# Patient Record
Sex: Male | Born: 1985 | Race: White | Hispanic: No | Marital: Single | Smoking: Current every day smoker
Health system: Southern US, Community
[De-identification: ages and names within clinical notes are randomized; demographics above are authoritative.]

## PROBLEM LIST (undated history)

## (undated) HISTORY — PX: HUMERUS FRACTURE SURGERY: SHX670

## (undated) HISTORY — PX: OTHER SURGICAL HISTORY: SHX169

---

## 2014-01-01 DIAGNOSIS — Z9889 Other specified postprocedural states: Secondary | ICD-10-CM | POA: Insufficient documentation

## 2015-10-16 DIAGNOSIS — Z720 Tobacco use: Secondary | ICD-10-CM | POA: Insufficient documentation

## 2017-12-05 ENCOUNTER — Encounter: Payer: Self-pay | Admitting: *Deleted

## 2017-12-09 ENCOUNTER — Encounter (HOSPITAL_COMMUNITY): Payer: Self-pay | Admitting: Emergency Medicine

## 2017-12-09 ENCOUNTER — Emergency Department (HOSPITAL_COMMUNITY): Payer: Self-pay

## 2017-12-09 ENCOUNTER — Emergency Department (HOSPITAL_COMMUNITY)
Admission: EM | Admit: 2017-12-09 | Discharge: 2017-12-09 | Disposition: A | Discharge status: Home / Self Care | Payer: Self-pay | Attending: Emergency Medicine | Admitting: Emergency Medicine

## 2017-12-09 DIAGNOSIS — N132 Hydronephrosis with renal and ureteral calculous obstruction: Secondary | ICD-10-CM | POA: Insufficient documentation

## 2017-12-09 DIAGNOSIS — R1110 Vomiting, unspecified: Secondary | ICD-10-CM | POA: Insufficient documentation

## 2017-12-09 LAB — URINALYSIS, ROUTINE W REFLEX MICROSCOPIC
BACTERIA UA: NONE SEEN
BILIRUBIN URINE: NEGATIVE
Glucose, UA: NEGATIVE mg/dL
Ketones, ur: NEGATIVE mg/dL
Leukocytes, UA: NEGATIVE
NITRITE: NEGATIVE
PROTEIN: NEGATIVE mg/dL
RBC / HPF: >50 RBC/hpf — ABNORMAL HIGH (ref 0–5)
SPECIFIC GRAVITY, URINE: 1.01 (ref 1.005–1.030)
pH: 8 (ref 5.0–8.0)

## 2017-12-09 LAB — BASIC METABOLIC PANEL
Anion gap: 8 (ref 5–15)
BUN: 13 mg/dL (ref 6–20)
CHLORIDE: 106 mmol/L (ref 101–111)
CO2: 25 mmol/L (ref 22–32)
CREATININE: 1.05 mg/dL (ref 0.61–1.24)
Calcium: 9.3 mg/dL (ref 8.9–10.3)
Glucose, Bld: 128 mg/dL — ABNORMAL HIGH (ref 65–99)
POTASSIUM: 4.2 mmol/L (ref 3.5–5.1)
SODIUM: 139 mmol/L (ref 135–145)

## 2017-12-09 LAB — CBC
HCT: 43.3 % (ref 39.0–52.0)
Hemoglobin: 15.1 g/dL (ref 13.0–17.0)
MCH: 31.7 pg (ref 26.0–34.0)
MCHC: 34.9 g/dL (ref 30.0–36.0)
MCV: 90.8 fL (ref 78.0–100.0)
PLATELETS: 163 10*3/uL (ref 150–400)
RBC: 4.77 MIL/uL (ref 4.22–5.81)
RDW: 13 % (ref 11.5–15.5)
WBC: 8.8 10*3/uL (ref 4.0–10.5)

## 2017-12-09 MED ORDER — TAMSULOSIN HCL 0.4 MG PO CAPS: 0.4000 mg | ORAL_CAPSULE | Freq: Every day | ORAL | 0 refills | Status: DC

## 2017-12-09 MED ORDER — HYDROMORPHONE HCL 1 MG/ML IJ SOLN
1.0000 mg | Freq: Once | INTRAMUSCULAR | Status: AC
  Administered 2017-12-09: 1 mg via INTRAVENOUS
  Filled 2017-12-09: qty 1

## 2017-12-09 MED ORDER — OXYCODONE-ACETAMINOPHEN 5-325 MG PO TABS: 1.0000 | ORAL_TABLET | Freq: Three times a day (TID) | ORAL | 0 refills | Status: DC | PRN

## 2017-12-09 MED ORDER — ONDANSETRON HCL 4 MG/2ML IJ SOLN
4.0000 mg | Freq: Once | INTRAMUSCULAR | Status: AC
  Administered 2017-12-09: 4 mg via INTRAVENOUS
  Filled 2017-12-09: qty 2

## 2017-12-09 MED ORDER — ONDANSETRON 4 MG PO TBDP: 4.0000 mg | ORAL_TABLET | Freq: Three times a day (TID) | ORAL | 0 refills | Status: DC | PRN

## 2017-12-09 MED ORDER — KETOROLAC TROMETHAMINE 30 MG/ML IJ SOLN
15.0000 mg | Freq: Once | INTRAMUSCULAR | Status: AC
  Administered 2017-12-09: 15 mg via INTRAVENOUS
  Filled 2017-12-09: qty 1

## 2017-12-09 NOTE — ED Notes (Signed)
Bladder scan completed, reading.

## 2017-12-09 NOTE — ED Provider Notes (Signed)
[facility]-EMERGENCY DEPT Provider Note   CSN: [id number] Arrival date & time: 12/09/17  0156     History   Chief Complaint Chief Complaint  Patient presents with  . Flank Pain    HPI Jeremy Ingram is a 32 y.o. male.  HPI is  Patient presents with left flank pain.  Began today.  History of kidney stones and states this feels the same.  Some urinary pressure.  No fevers or chills.  Has had vomiting.  States it feels like kidney stones and his last was around 7 years ago.  States he had to have lithotripsy at that time.  No diarrhea.  No fevers. History reviewed. No pertinent past medical history.  There are no active problems to display for this patient.   History reviewed. No pertinent surgical history.      Home Medications    Prior to Admission medications   Medication Sig Start Date End Date Taking? Authorizing Provider  diphenhydrAMINE (BENADRYL) 25 MG tablet Take 25 mg by mouth every 6 (six) hours as needed for sleep.   Yes [provider]  ondansetron (ZOFRAN-ODT) 4 MG disintegrating tablet Take 1 tablet (4 mg total) by mouth every 8 (eight) hours as needed for nausea or vomiting. 12/09/17   Benjiman Core, MD  oxyCODONE-acetaminophen (PERCOCET/ROXICET) 5-325 MG tablet Take 1-2 tablets by mouth every 8 (eight) hours as needed for severe pain. 12/09/17   Benjiman Core, MD  tamsulosin (FLOMAX) 0.4 MG CAPS capsule Take 1 capsule (0.4 mg total) by mouth daily. 12/09/17   Benjiman Core, MD    Family History No family history on file.  Social History Social History   Tobacco Use  . Smoking status: Not on file  Substance Use Topics  . Alcohol use: Not on file  . Drug use: Not on file     Allergies   Bactrim [sulfamethoxazole-trimethoprim] and Pertussis vaccines   Review of Systems Review of Systems  Constitutional: Negative for appetite change.  HENT: Negative for congestion.   Respiratory: Negative for shortness of  breath.   Cardiovascular: Negative for chest pain.  Gastrointestinal: Positive for abdominal pain and vomiting.  Genitourinary: Positive for flank pain.  Musculoskeletal: Negative for back pain.  Skin: Negative for rash.  Neurological: Negative for speech difficulty and weakness.  Hematological: Negative for adenopathy.  Psychiatric/Behavioral: Negative for confusion.     Physical Exam Updated Vital Signs BP 104/71 (BP Location: Right Arm)   Pulse 74   Temp 98.4 F (36.9 C) (Oral)   Resp 18   SpO2 100%   Physical Exam  Constitutional: He appears well-developed.  Patient appears uncomfortable  HENT:  Head: Atraumatic.  Eyes: Pupils are equal, round, and reactive to light.  Neck: Neck supple.  Pulmonary/Chest: Effort normal.  Abdominal: There is tenderness.  Left mid abdominal tenderness.  Genitourinary:  Genitourinary Comments: CVA tenderness on left side.  Musculoskeletal: He exhibits no tenderness.  Neurological: He is alert.  Skin: Skin is warm. Capillary refill takes less than 2 seconds.     ED Treatments / Results  Labs (all labs ordered are listed, but only abnormal results are displayed) Labs Reviewed  URINALYSIS, ROUTINE W REFLEX MICROSCOPIC - Abnormal; Notable for the following components:      Result Value   Color, Urine STRAW (*)    Hgb urine dipstick LARGE (*)    RBC / HPF >50 (*)    All other components within normal limits  BASIC METABOLIC PANEL - Abnormal; Notable  for the following components:   Glucose, Bld 128 (*)    All other components within normal limits  CBC    EKG None  Radiology Ct Renal Stone Study  Result Date: 12/09/2017 CLINICAL DATA:  Acute onset of left flank pain and difficulty urinating. EXAM: CT ABDOMEN AND PELVIS WITHOUT CONTRAST TECHNIQUE: Multidetector CT imaging of the abdomen and pelvis was performed following the standard protocol without IV contrast. COMPARISON:  None. FINDINGS: Lower chest: The visualized lung bases  are grossly clear. The visualized portions of the mediastinum are unremarkable. Minimal wall thickening at the distal esophagus may reflect chronic inflammation. Hepatobiliary: The liver is unremarkable in appearance. The gallbladder is unremarkable in appearance. The common bile duct remains normal in caliber. Pancreas: The pancreas is within normal limits. Spleen: The spleen is unremarkable in appearance. Adrenals/Urinary Tract: The adrenal glands are unremarkable. There is mild left-sided hydronephrosis, with left-sided perinephric stranding and fluid. An obstructing 6 x 5 mm stone is noted at the mid left ureter, 7 cm below the left renal pelvis. The right kidney is unremarkable. No nonobstructing renal stones are identified. Stomach/Bowel: The stomach is unremarkable in appearance. The small bowel is within normal limits. The appendix is normal in caliber, without evidence of appendicitis. The colon is unremarkable in appearance. Vascular/Lymphatic: The abdominal aorta is unremarkable in appearance. The inferior vena cava is grossly unremarkable. No retroperitoneal lymphadenopathy is seen. No pelvic sidewall lymphadenopathy is identified. Reproductive: The bladder is mildly distended and grossly unremarkable. The prostate is normal in size. Other: No additional soft tissue abnormalities are seen. Musculoskeletal: No acute osseous abnormalities are identified. The visualized musculature is unremarkable in appearance. IMPRESSION: 1. Mild left-sided hydronephrosis, with left-sided perinephric stranding and fluid. Obstructing 6 x 5 mm stone noted at the mid left ureter, 7 cm below the left renal pelvis. 2. Minimal wall thickening at the distal esophagus may reflect chronic inflammation. Electronically Signed   By: Roanna RaiderJeffery  Chang M.D.   On: 12/09/2017 03:10    Procedures Procedures (including critical care time)  Medications Ordered in ED Medications  HYDROmorphone (DILAUDID) injection 1 mg (1 mg Intravenous  Given 12/09/17 0226)  ondansetron (ZOFRAN) injection 4 mg (4 mg Intravenous Given 12/09/17 0226)  ketorolac (TORADOL) 30 MG/ML injection 15 mg (15 mg Intravenous Given 12/09/17 0251)     Initial Impression / Assessment and Plan / ED Course  I have reviewed the triage vital signs and the nursing notes.  Pertinent labs & imaging results that were available during my care of the patient were reviewed by me and considered in my medical decision making (see chart for details).     Patient with left-sided flank pain.  Found to have 5 mm ureteral stone.  Pain improved.  Will discharge home to follow-up with urology.  Given Percocet Zofran and Flomax for home  Final Clinical Impressions(s) / ED Diagnoses   Final diagnoses:  Ureteral stone with hydronephrosis    ED Discharge Orders        Ordered    oxyCODONE-acetaminophen (PERCOCET/ROXICET) 5-325 MG tablet  Every 8 hours PRN     12/09/17 0345    ondansetron (ZOFRAN-ODT) 4 MG disintegrating tablet  Every 8 hours PRN     12/09/17 0345    tamsulosin (FLOMAX) 0.4 MG CAPS capsule  Daily     12/09/17 0345       Benjiman CorePickering, Latif Nazareno, MD 12/09/17 [phone]

## 2017-12-09 NOTE — ED Triage Notes (Signed)
Pt comes to ed, via ems, left flank for a few hrs, difficulties with urination. Hx of  Kidney stones. V/s on arrival bp 142/89, hr 52, spo2 100, rr16.  20 left forearm.   Alert x4. No known allergies.

## 2017-12-09 NOTE — ED Notes (Signed)
Bed: WA17 Expected date:  Expected time:  Means of arrival:  Comments: 32 yr old flank pain

## 2017-12-13 ENCOUNTER — Ambulatory Visit: Payer: Self-pay | Attending: Critical Care Medicine | Admitting: Critical Care Medicine

## 2017-12-13 ENCOUNTER — Encounter: Payer: Self-pay | Admitting: Critical Care Medicine

## 2017-12-13 ENCOUNTER — Other Ambulatory Visit: Payer: Self-pay

## 2017-12-13 VITALS — BP 111/75 | HR 69 | Temp 97.5°F | Resp 16 | Ht 73.0 in | Wt 163.0 lb

## 2017-12-13 DIAGNOSIS — Z8709 Personal history of other diseases of the respiratory system: Secondary | ICD-10-CM | POA: Insufficient documentation

## 2017-12-13 DIAGNOSIS — J209 Acute bronchitis, unspecified: Secondary | ICD-10-CM | POA: Insufficient documentation

## 2017-12-13 DIAGNOSIS — Z716 Tobacco abuse counseling: Secondary | ICD-10-CM | POA: Insufficient documentation

## 2017-12-13 DIAGNOSIS — R55 Syncope and collapse: Secondary | ICD-10-CM | POA: Insufficient documentation

## 2017-12-13 DIAGNOSIS — Z720 Tobacco use: Secondary | ICD-10-CM

## 2017-12-13 DIAGNOSIS — Z882 Allergy status to sulfonamides status: Secondary | ICD-10-CM | POA: Insufficient documentation

## 2017-12-13 DIAGNOSIS — Z87442 Personal history of urinary calculi: Secondary | ICD-10-CM | POA: Insufficient documentation

## 2017-12-13 DIAGNOSIS — Z79899 Other long term (current) drug therapy: Secondary | ICD-10-CM | POA: Insufficient documentation

## 2017-12-13 DIAGNOSIS — Z023 Encounter for examination for recruitment to armed forces: Secondary | ICD-10-CM | POA: Insufficient documentation

## 2017-12-13 DIAGNOSIS — J310 Chronic rhinitis: Secondary | ICD-10-CM | POA: Insufficient documentation

## 2017-12-13 DIAGNOSIS — J45909 Unspecified asthma, uncomplicated: Secondary | ICD-10-CM | POA: Insufficient documentation

## 2017-12-13 DIAGNOSIS — F17210 Nicotine dependence, cigarettes, uncomplicated: Secondary | ICD-10-CM | POA: Insufficient documentation

## 2017-12-13 MED ORDER — TAMSULOSIN HCL 0.4 MG PO CAPS: 0.4000 mg | ORAL_CAPSULE | Freq: Every day | ORAL | 0 refills | Status: AC

## 2017-12-13 NOTE — Assessment & Plan Note (Signed)
Counseled for smoking cessation 

## 2017-12-13 NOTE — Assessment & Plan Note (Signed)
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

## 2017-12-13 NOTE — Progress Notes (Signed)
Subjective:    Patient ID: Jeremy Ingram, male    DOB: 1986-01-17, 32 y.o.   MRN: [id number]  32 y.o.M with hx of presyncope.  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 Pt was just in ED 12/09/17 for ureteral stone.  Pt has f/u with [facility] pending   [place] had an episode while living in [place], and PCP put Asthma on the chart.  Took a long time to correct.  Pt needs to be cleared he does not have asthma.  Pt then went to urgent care for PFTs,  Signed off as normal.  No hx of dyspnea.  Did have episode of bronchitis in the past.  No real cough now.  No chest pains.  No wheeze.  No mucus.  Notes some rhinitis with dust exposure.    No other allergies.  No chronic cough.     History reviewed. No pertinent past medical history.   History reviewed. No pertinent family history.   Social History   Socioeconomic History  . Marital status: Single    Spouse name: Not on file  . Number of children: Not on file  . Years of education: Not on file  . Highest education level: Not on file  Occupational History  . Not on file  Social Needs  . Financial resource strain: Not on file  . Food insecurity:    Worry: Not on file    Inability: Not on file  . Transportation needs:    Medical: Not on file    Non-medical: Not on file  Tobacco Use  . Smoking status: Current Every Day Smoker    Types: Cigarettes  . Smokeless tobacco: Never Used  Substance and Sexual Activity  . Alcohol use: Not on file  . Drug use: Not on file  . Sexual activity: Not on file  Lifestyle  . Physical activity:    Days per week: Not on file    Minutes per session: Not on file  . Stress: Not on file  Relationships  . Social connections:    Talks on phone: Not on file    Gets together: Not on file    Attends religious service: Not on file    Active member of club or organization: Not on file    Attends meetings of clubs or organizations: Not on file    Relationship status: Not on file  .  Intimate partner violence:    Fear of current or ex partner: Not on file    Emotionally abused: Not on file    Physically abused: Not on file    Forced sexual activity: Not on file  Other Topics Concern  . Not on file  Social History Narrative  . Not on file     Allergies  Allergen Reactions  . Bactrim [Sulfamethoxazole-Trimethoprim] Swelling  . Pertussis Vaccines      Outpatient Medications Prior to Visit  Medication Sig Dispense Refill  . diphenhydrAMINE (BENADRYL) 25 MG tablet Take 25 mg by mouth every 6 (six) hours as needed for sleep.    Marland Kitchen. oxyCODONE (OXY IR/ROXICODONE) 5 MG immediate release tablet Take 5 mg by mouth every 6 (six) hours as needed.    . ondansetron (ZOFRAN-ODT) 4 MG disintegrating tablet Take 1 tablet (4 mg total) by mouth every 8 (eight) hours as needed for nausea or vomiting. 8 tablet 0  . oxyCODONE-acetaminophen (PERCOCET/ROXICET) 5-325 MG tablet Take 1-2 tablets by mouth every 8 (eight) hours as needed for severe pain. 10 tablet  0  . tamsulosin (FLOMAX) 0.4 MG CAPS capsule Take 1 capsule (0.4 mg total) by mouth daily. 7 capsule 0   No facility-administered medications prior to visit.       Review of Systems  Constitutional: Negative.   HENT: Negative.   Eyes: Negative.   Respiratory: Negative for apnea, cough, chest tightness, shortness of breath, wheezing and stridor.   Cardiovascular: Negative for chest pain and leg swelling.  Gastrointestinal: Negative.   Genitourinary: Positive for flank pain. Negative for urgency.  Neurological: Negative.   Hematological: Negative.   Psychiatric/Behavioral: Negative.        Objective:   Physical Exam Vitals:   12/13/17 0851  BP: 111/75  Pulse: 69  Resp: 16  Temp: (!) 97.5 F (36.4 C)  TempSrc: Oral  SpO2: 100%  Weight: 163 lb (73.9 kg)  Height: 6\' 1"  (1.854 m)    Gen: Pleasant, well-nourished, in no distress,  normal affect  ENT: No lesions,  mouth clear,  oropharynx clear, no postnasal  drip  Neck: No JVD, no TMG, no carotid bruits  Lungs: No use of accessory muscles, no dullness to percussion, clear without rales or rhonchi  Cardiovascular: RRR, heart sounds normal, no murmur or gallops, no peripheral edema  Abdomen: soft and NT, no HSM,  BS normal  Musculoskeletal: No deformities, no cyanosis or clubbing  Neuro: alert, non focal  Skin: Warm, no lesions or rashes  No results found.        Assessment & Plan:  I personally reviewed all images and lab data in the [facility] system as well as any outside material available during this office visit and agree with the  radiology impressions.   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    I

## 2017-12-13 NOTE — Patient Instructions (Signed)
Focus on smoking cessation A lung function test will be obtained We will obtained the lung function actual results from [facility] We will call you when all paper work is prepared We expect to be able to clear you for [employer] duty Return as needed

## 2017-12-13 NOTE — Progress Notes (Signed)
Pt request evaluation and letter from pulmonologist regarding PFT.  Clearance needed to enlist in [employer].

## 2017-12-15 ENCOUNTER — Encounter: Payer: Self-pay | Admitting: Critical Care Medicine

## 2017-12-15 ENCOUNTER — Ambulatory Visit (HOSPITAL_COMMUNITY)
Admission: RE | Admit: 2017-12-15 | Discharge: 2017-12-15 | Disposition: A | Discharge status: Home / Self Care | Payer: Self-pay | Source: Ambulatory Visit | Attending: Critical Care Medicine | Admitting: Critical Care Medicine

## 2017-12-15 DIAGNOSIS — Z8709 Personal history of other diseases of the respiratory system: Secondary | ICD-10-CM | POA: Insufficient documentation

## 2017-12-15 LAB — PULMONARY FUNCTION TEST
FEF 25-75 Post: 4.45 L/sec
FEF 25-75 Pre: 3.18 L/sec
FEF2575-%Change-Post: 39 %
FEF2575-%Pred-Post: 96 %
FEF2575-%Pred-Pre: 69 %
FEV1-%CHANGE-POST: 7 %
FEV1-%PRED-PRE: 90 %
FEV1-%Pred-Post: 97 %
FEV1-PRE: 4.33 L
FEV1-Post: 4.65 L
FEV1FVC-%CHANGE-POST: 7 %
FEV1FVC-%Pred-Pre: 91 %
FEV6-%Change-Post: 0 %
FEV6-%PRED-PRE: 98 %
FEV6-%Pred-Post: 98 %
FEV6-POST: 5.75 L
FEV6-PRE: 5.77 L
FEV6FVC-%Change-Post: 1 %
FEV6FVC-%PRED-POST: 102 %
FEV6FVC-%PRED-PRE: 100 %
FVC-%CHANGE-POST: 0 %
FVC-%PRED-POST: 98 %
FVC-%PRED-PRE: 98 %
FVC-POST: 5.83 L
FVC-PRE: 5.86 L
POST FEV6/FVC RATIO: 100 %
PRE FEV1/FVC RATIO: 74 %
PRE FEV6/FVC RATIO: 98 %
Post FEV1/FVC ratio: 80 %

## 2017-12-15 MED ORDER — ALBUTEROL SULFATE (2.5 MG/3ML) 0.083% IN NEBU
2.5000 mg | INHALATION_SOLUTION | Freq: Once | RESPIRATORY_TRACT | Status: AC
  Administered 2017-12-15: 2.5 mg via RESPIRATORY_TRACT

## 2017-12-15 NOTE — Progress Notes (Signed)
To whom it may concern:  Mr Jeremy Ingram was seen in my pulmonary clinic on 12/13/17 for a pulmonary evaluation and lung function testing.  I have reviewed his [facility] and the letter they produced from 07/21/2017 as well as his current lung function tests performed today 12/15/17:  The lung function tests in both case were NORMAL.  His exam and office visit impression was that [name] does not now have nor has he ever had asthma or any other chronic recurring lung disease.  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    Sincerely   Shan LevansPatrick Wright MD [facility]

## 2017-12-18 ENCOUNTER — Telehealth: Payer: Self-pay | Admitting: *Deleted

## 2017-12-18 NOTE — Telephone Encounter (Signed)
Patient verified DOB Patient is aware of results and letter being placed at the front desk for pick up tomorrow morning. No further questions.

## 2017-12-18 NOTE — Telephone Encounter (Signed)
-----   Message from Storm FriskPatrick E Wright, MD sent at 12/15/2017 12:40 PM EDT ----- Please call the patient and let him know his PFTs are NORMAL.  I will compose a letter documenting this for military clearance.   Please provide him a copy of this letter and his office note as well as my followup note.

## 2017-12-20 ENCOUNTER — Telehealth: Payer: Self-pay | Admitting: *Deleted

## 2017-12-20 ENCOUNTER — Encounter: Payer: Self-pay | Admitting: *Deleted

## 2017-12-20 NOTE — Telephone Encounter (Signed)
PFT report received on yesterday from [facility]. Reviewed by Dr. Delford FieldWright. 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  Attempt to call patient. Unable to leave message on voicemail. Voicemail is full.

## 2017-12-25 ENCOUNTER — Telehealth: Payer: Self-pay | Admitting: Family Medicine

## 2017-12-25 NOTE — Telephone Encounter (Signed)
13 page, paperwork received through fax 12-25-17.

## 2018-03-30 ENCOUNTER — Telehealth: Payer: Self-pay | Admitting: General Practice

## 2018-03-30 NOTE — Telephone Encounter (Signed)
Patient called saying he needed updated paperwork from Dr.Wright stating that he is okay to go back. They paperwork that was already sent was "inadequate" according to the [employer]. Please follow up with patient. He provided the fax number where the updated paperwork would need to be sent.  Fax is [phone].

## 2018-03-30 NOTE — Telephone Encounter (Signed)
Paperwork was with Dr.Wright

## 2018-04-04 NOTE — Telephone Encounter (Signed)
Left message on voicemail to return call. I will attempt to call patient to gather more information.

## 2019-05-08 IMAGING — CT CT RENAL STONE PROTOCOL
2 of 4 series · 15 of 46 positions shown, 17 images · non-contrast
Comparison: None.

CLINICAL DATA: Acute onset of left flank pain and difficulty
urinating.

EXAM:
CT ABDOMEN AND PELVIS WITHOUT CONTRAST
TECHNIQUE: Multidetector CT imaging of the abdomen and pelvis was performed
following the standard protocol without IV contrast.

[Series 2: axial st · axial · 0.67mm/px · z∈[+704,+1140]mm · 12 of 97 slices shown, 14 images]
[im 5/97  soft-tissue]
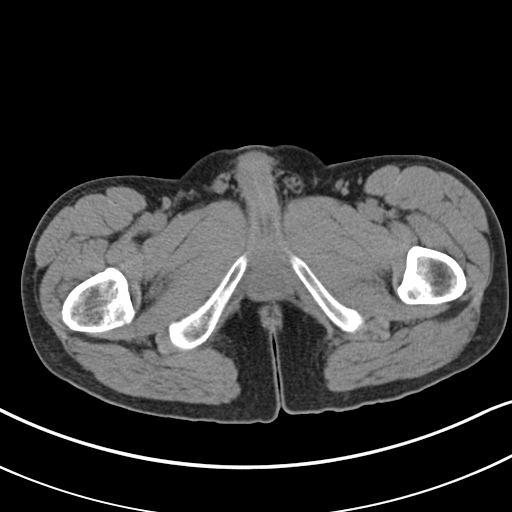
[im 5/97  bone]
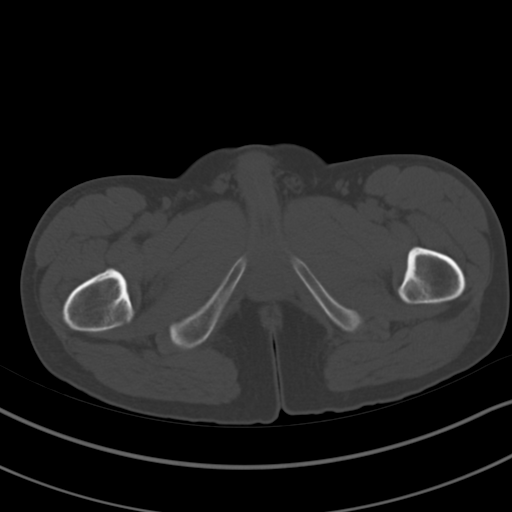
[im 14/97  soft-tissue]
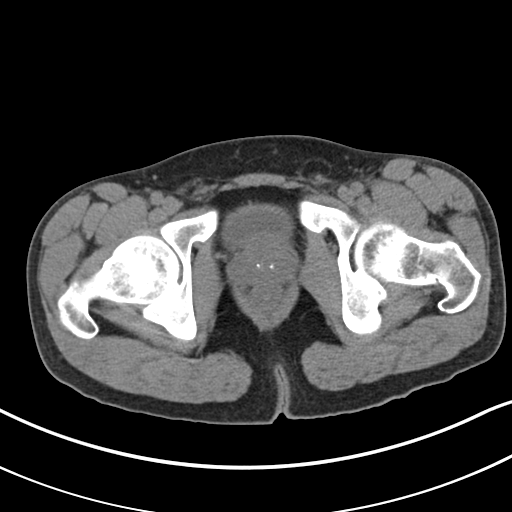
[im 23/97  soft-tissue]
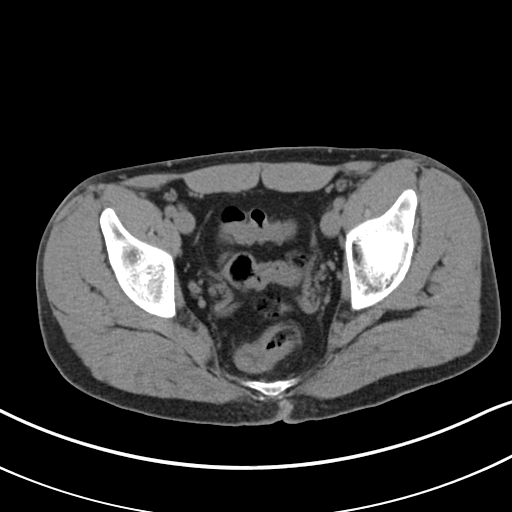
[im 28/97  soft-tissue]
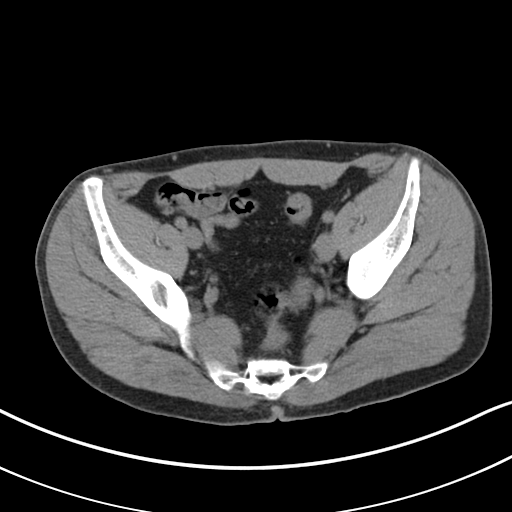
[im 37/97  soft-tissue]
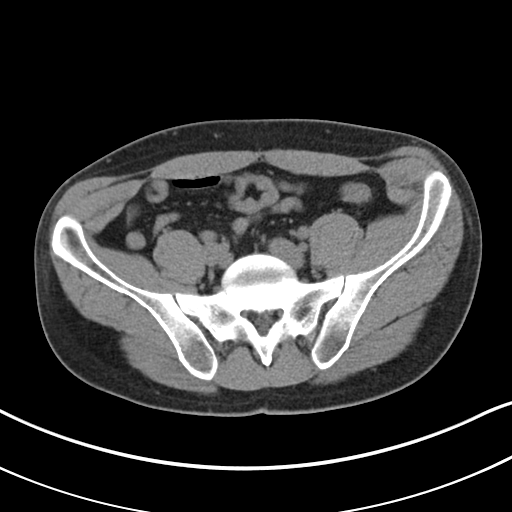
[im 46/97  soft-tissue]
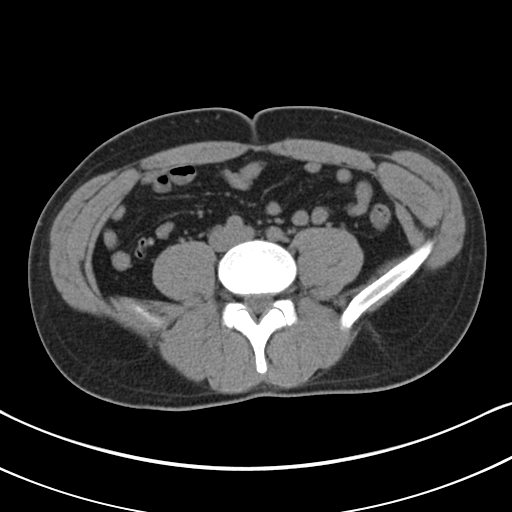
[im 51/97  soft-tissue]
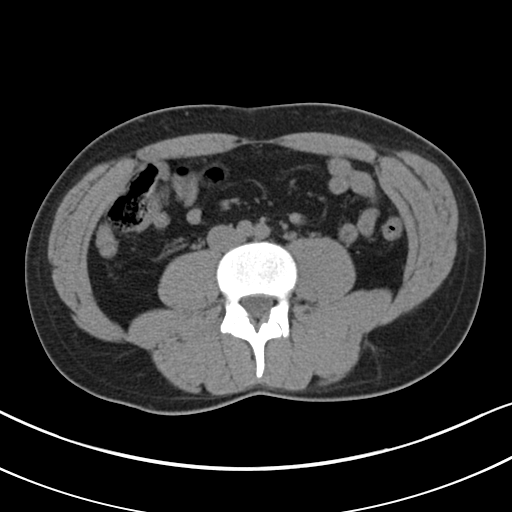
[im 60/97  soft-tissue]
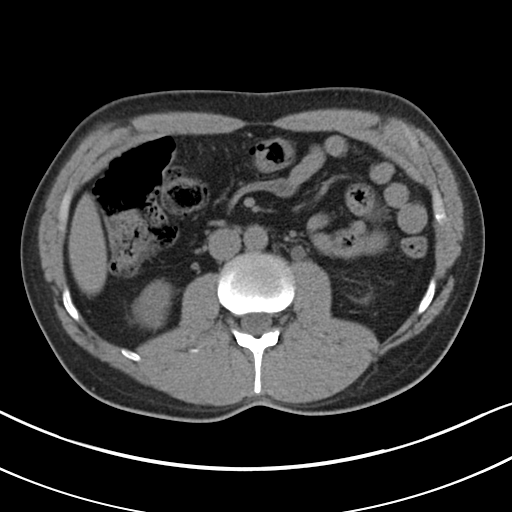
[im 69/97  soft-tissue]
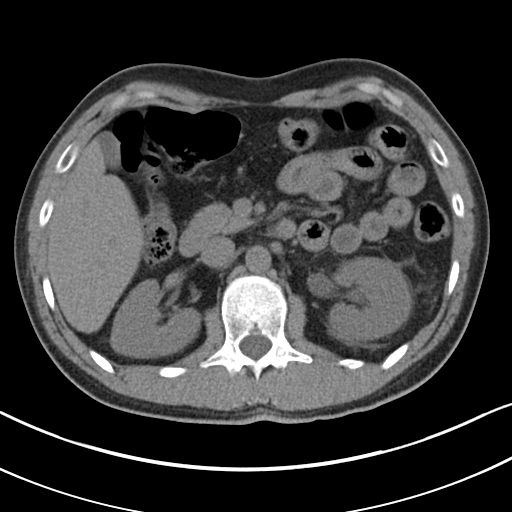
[im 69/97  bone]
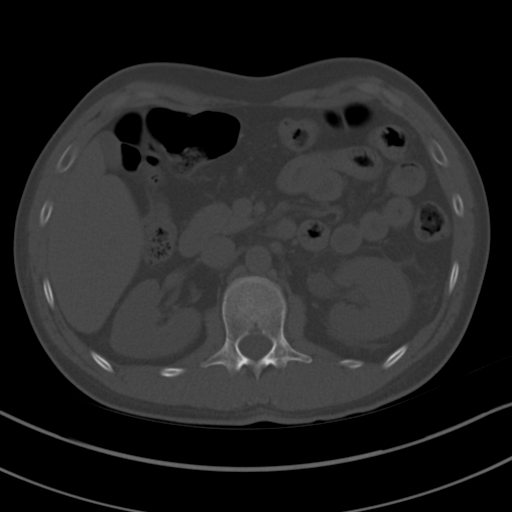
[im 74/97  soft-tissue]
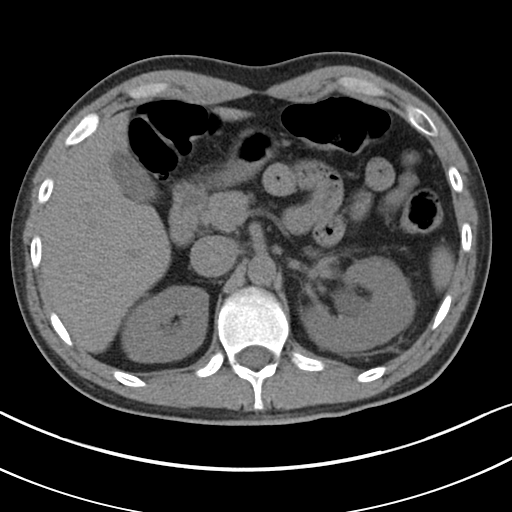
[im 83/97  soft-tissue]
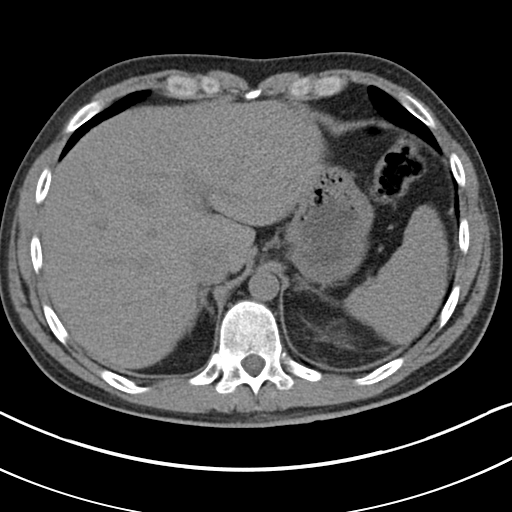
[im 92/97  soft-tissue]
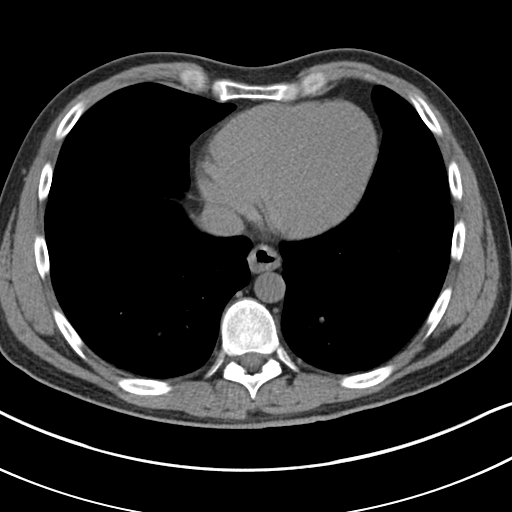

[Series 5: coronal · coronal · 0.72mm/px · 3 of 128 slices shown]
[im 43/128  soft-tissue]
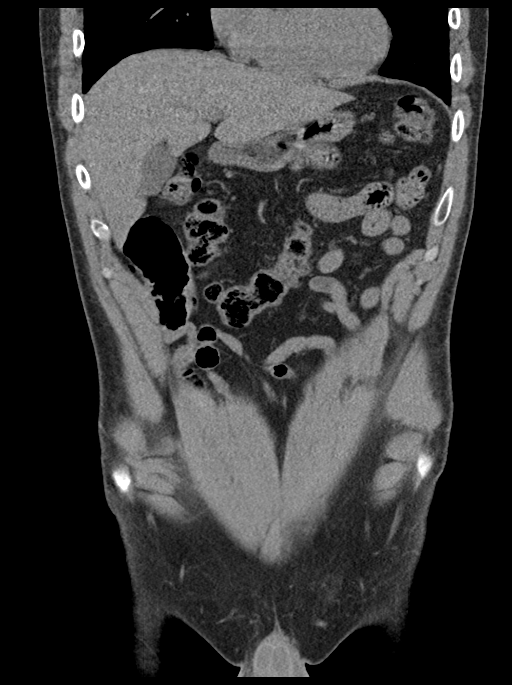
[im 57/128  soft-tissue]
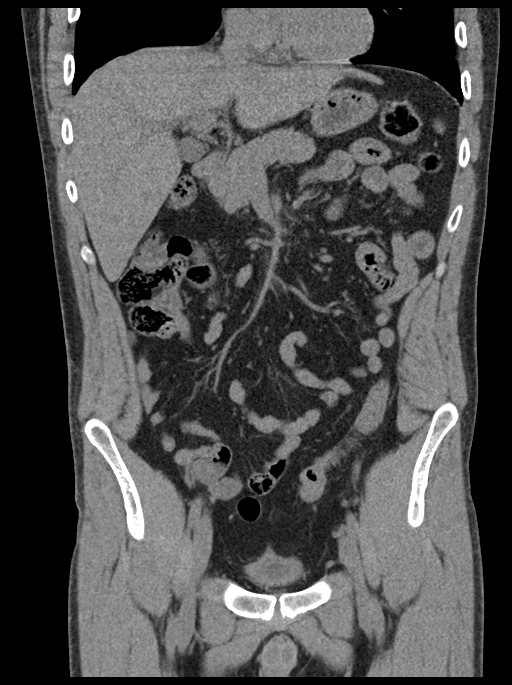
[im 71/128  soft-tissue]
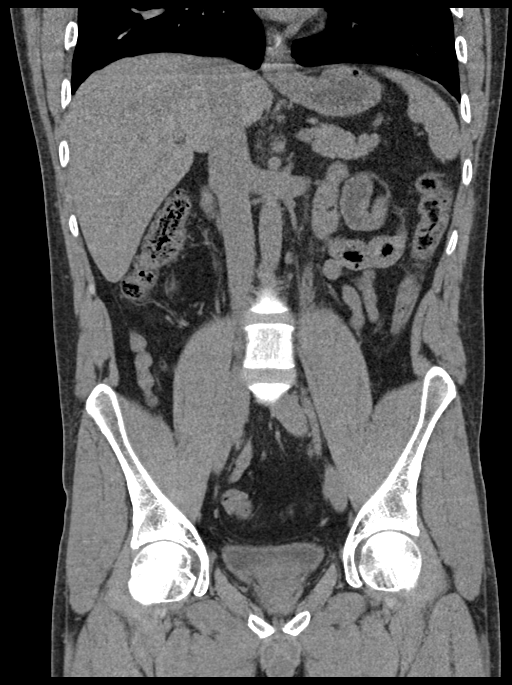

[15 of 46 positions shown; findings below may reference images not displayed]

FINDINGS: Lower chest: The visualized lung bases are grossly clear. The
visualized portions of the mediastinum are unremarkable. Minimal
wall thickening at the distal esophagus may reflect chronic
inflammation.

Hepatobiliary: The liver is unremarkable in appearance. The
gallbladder is unremarkable in appearance. The common bile duct
remains normal in caliber.

Pancreas: The pancreas is within normal limits.

Spleen: The spleen is unremarkable in appearance.

Adrenals/Urinary Tract: The adrenal glands are unremarkable.

There is mild left-sided hydronephrosis, with left-sided perinephric
stranding and fluid. An obstructing 6 x 5 mm stone is noted at the
mid left ureter, 7 cm below the left renal pelvis. The right kidney
is unremarkable. No nonobstructing renal stones are identified.

Stomach/Bowel: The stomach is unremarkable in appearance. The small
bowel is within normal limits. The appendix is normal in caliber,
without evidence of appendicitis. The colon is unremarkable in
appearance.

Vascular/Lymphatic: The abdominal aorta is unremarkable in
appearance. The inferior vena cava is grossly unremarkable. No
retroperitoneal lymphadenopathy is seen. No pelvic sidewall
lymphadenopathy is identified.

Reproductive: The bladder is mildly distended and grossly
unremarkable. The prostate is normal in size.

Other: No additional soft tissue abnormalities are seen.

Musculoskeletal: No acute osseous abnormalities are identified. The
visualized musculature is unremarkable in appearance.
IMPRESSION: 1. Mild left-sided hydronephrosis, with left-sided perinephric
stranding and fluid. Obstructing 6 x 5 mm stone noted at the mid
left ureter, 7 cm below the left renal pelvis.
2. Minimal wall thickening at the distal esophagus may reflect
chronic inflammation.
